# Patient Record
Sex: Female | Born: 1970 | Race: White | Hispanic: No
Health system: Southern US, Community
[De-identification: ages and names within clinical notes are randomized; demographics above are authoritative.]

---

## 2013-09-30 ENCOUNTER — Emergency Department: Payer: Self-pay | Admitting: Internal Medicine

## 2013-10-21 ENCOUNTER — Emergency Department: Payer: Self-pay | Admitting: Internal Medicine

## 2013-11-07 ENCOUNTER — Emergency Department: Payer: Self-pay | Admitting: Emergency Medicine

## 2013-11-24 ENCOUNTER — Inpatient Hospital Stay: Payer: Self-pay | Admitting: Internal Medicine

## 2013-11-24 LAB — COMPREHENSIVE METABOLIC PANEL
Albumin: 3.7 g/dL (ref 3.4–5.0)
Alkaline Phosphatase: 65 U/L
Anion Gap: 4 — ABNORMAL LOW (ref 7–16)
Bilirubin,Total: 0.4 mg/dL (ref 0.2–1.0)
Calcium, Total: 9 mg/dL (ref 8.5–10.1)
Chloride: 109 mmol/L — ABNORMAL HIGH (ref 98–107)
Co2: 26 mmol/L (ref 21–32)
Creatinine: 0.64 mg/dL (ref 0.60–1.30)
EGFR (Non-African Amer.): >60
SGOT(AST): 31 U/L (ref 15–37)
Sodium: 139 mmol/L (ref 136–145)
Total Protein: 7.1 g/dL (ref 6.4–8.2)

## 2013-11-24 LAB — CBC
MCH: 31.5 pg (ref 26.0–34.0)
RDW: 13.8 % (ref 11.5–14.5)

## 2013-11-24 LAB — CK TOTAL AND CKMB (NOT AT ARMC): CK, Total: 144 U/L (ref 21–215)

## 2013-11-24 LAB — TROPONIN I: Troponin-I: <0.02 ng/mL

## 2013-11-27 LAB — CBC WITH DIFFERENTIAL/PLATELET
Basophil #: 0 10*3/uL (ref 0.0–0.1)
Basophil %: 0.1 %
Eosinophil #: 0 10*3/uL (ref 0.0–0.7)
Eosinophil %: 0.2 %
HCT: 38.6 % (ref 35.0–47.0)
Lymphocyte #: 0.9 10*3/uL — ABNORMAL LOW (ref 1.0–3.6)
MCH: 32 pg (ref 26.0–34.0)
MCHC: 33.2 g/dL (ref 32.0–36.0)
Neutrophil #: 19.8 10*3/uL — ABNORMAL HIGH (ref 1.4–6.5)
Neutrophil %: 93.6 %
Platelet: 305 10*3/uL (ref 150–440)
WBC: 21.2 10*3/uL — ABNORMAL HIGH (ref 3.6–11.0)

## 2014-01-31 ENCOUNTER — Emergency Department: Payer: Self-pay | Admitting: Emergency Medicine

## 2014-02-13 ENCOUNTER — Emergency Department: Payer: Self-pay | Admitting: Emergency Medicine

## 2014-05-19 ENCOUNTER — Emergency Department: Payer: Self-pay | Admitting: Emergency Medicine

## 2014-06-11 ENCOUNTER — Emergency Department: Payer: Self-pay | Admitting: Emergency Medicine

## 2014-06-11 LAB — CBC
HCT: 43.6 % (ref 35.0–47.0)
HGB: 14.1 g/dL (ref 12.0–16.0)
MCH: 31.3 pg (ref 26.0–34.0)
MCHC: 32.4 g/dL (ref 32.0–36.0)
MCV: 97 fL (ref 80–100)
PLATELETS: 371 10*3/uL (ref 150–440)
RBC: 4.5 10*6/uL (ref 3.80–5.20)
RDW: 13.9 % (ref 11.5–14.5)
WBC: 11.7 10*3/uL — AB (ref 3.6–11.0)

## 2014-06-11 LAB — BASIC METABOLIC PANEL
Anion Gap: 5 — ABNORMAL LOW (ref 7–16)
BUN: 8 mg/dL (ref 7–18)
CALCIUM: 8.2 mg/dL — AB (ref 8.5–10.1)
CHLORIDE: 108 mmol/L — AB (ref 98–107)
CREATININE: 0.83 mg/dL (ref 0.60–1.30)
Co2: 26 mmol/L (ref 21–32)
EGFR (African American): >60
EGFR (Non-African Amer.): >60
GLUCOSE: 109 mg/dL — AB (ref 65–99)
Osmolality: 276 (ref 275–301)
Potassium: 3.9 mmol/L (ref 3.5–5.1)
Sodium: 139 mmol/L (ref 136–145)

## 2014-06-11 LAB — TROPONIN I

## 2014-09-17 ENCOUNTER — Emergency Department: Payer: Self-pay | Admitting: Emergency Medicine

## 2014-09-17 LAB — URINALYSIS, COMPLETE
Bilirubin,UR: NEGATIVE
Glucose,UR: NEGATIVE mg/dL (ref 0–75)
Ketone: NEGATIVE
NITRITE: NEGATIVE
PROTEIN: NEGATIVE
Ph: 6 (ref 4.5–8.0)
SPECIFIC GRAVITY: 1.002 (ref 1.003–1.030)
SQUAMOUS EPITHELIAL: NONE SEEN
WBC UR: 214 /HPF (ref 0–5)

## 2014-09-21 LAB — URINE CULTURE

## 2014-10-19 ENCOUNTER — Emergency Department: Payer: Self-pay | Admitting: Emergency Medicine

## 2015-02-07 IMAGING — CR DG CHEST 2V
1 series · 2 of 2 positions shown · non-contrast
Comparison: 09/30/2013

CLINICAL DATA: Shortness of breath for 2 days.

EXAM:
CHEST  2 VIEW

[Series 1: w chest pa · 0.14mm/px · 2 of 2 slices shown]
[im 1/2]
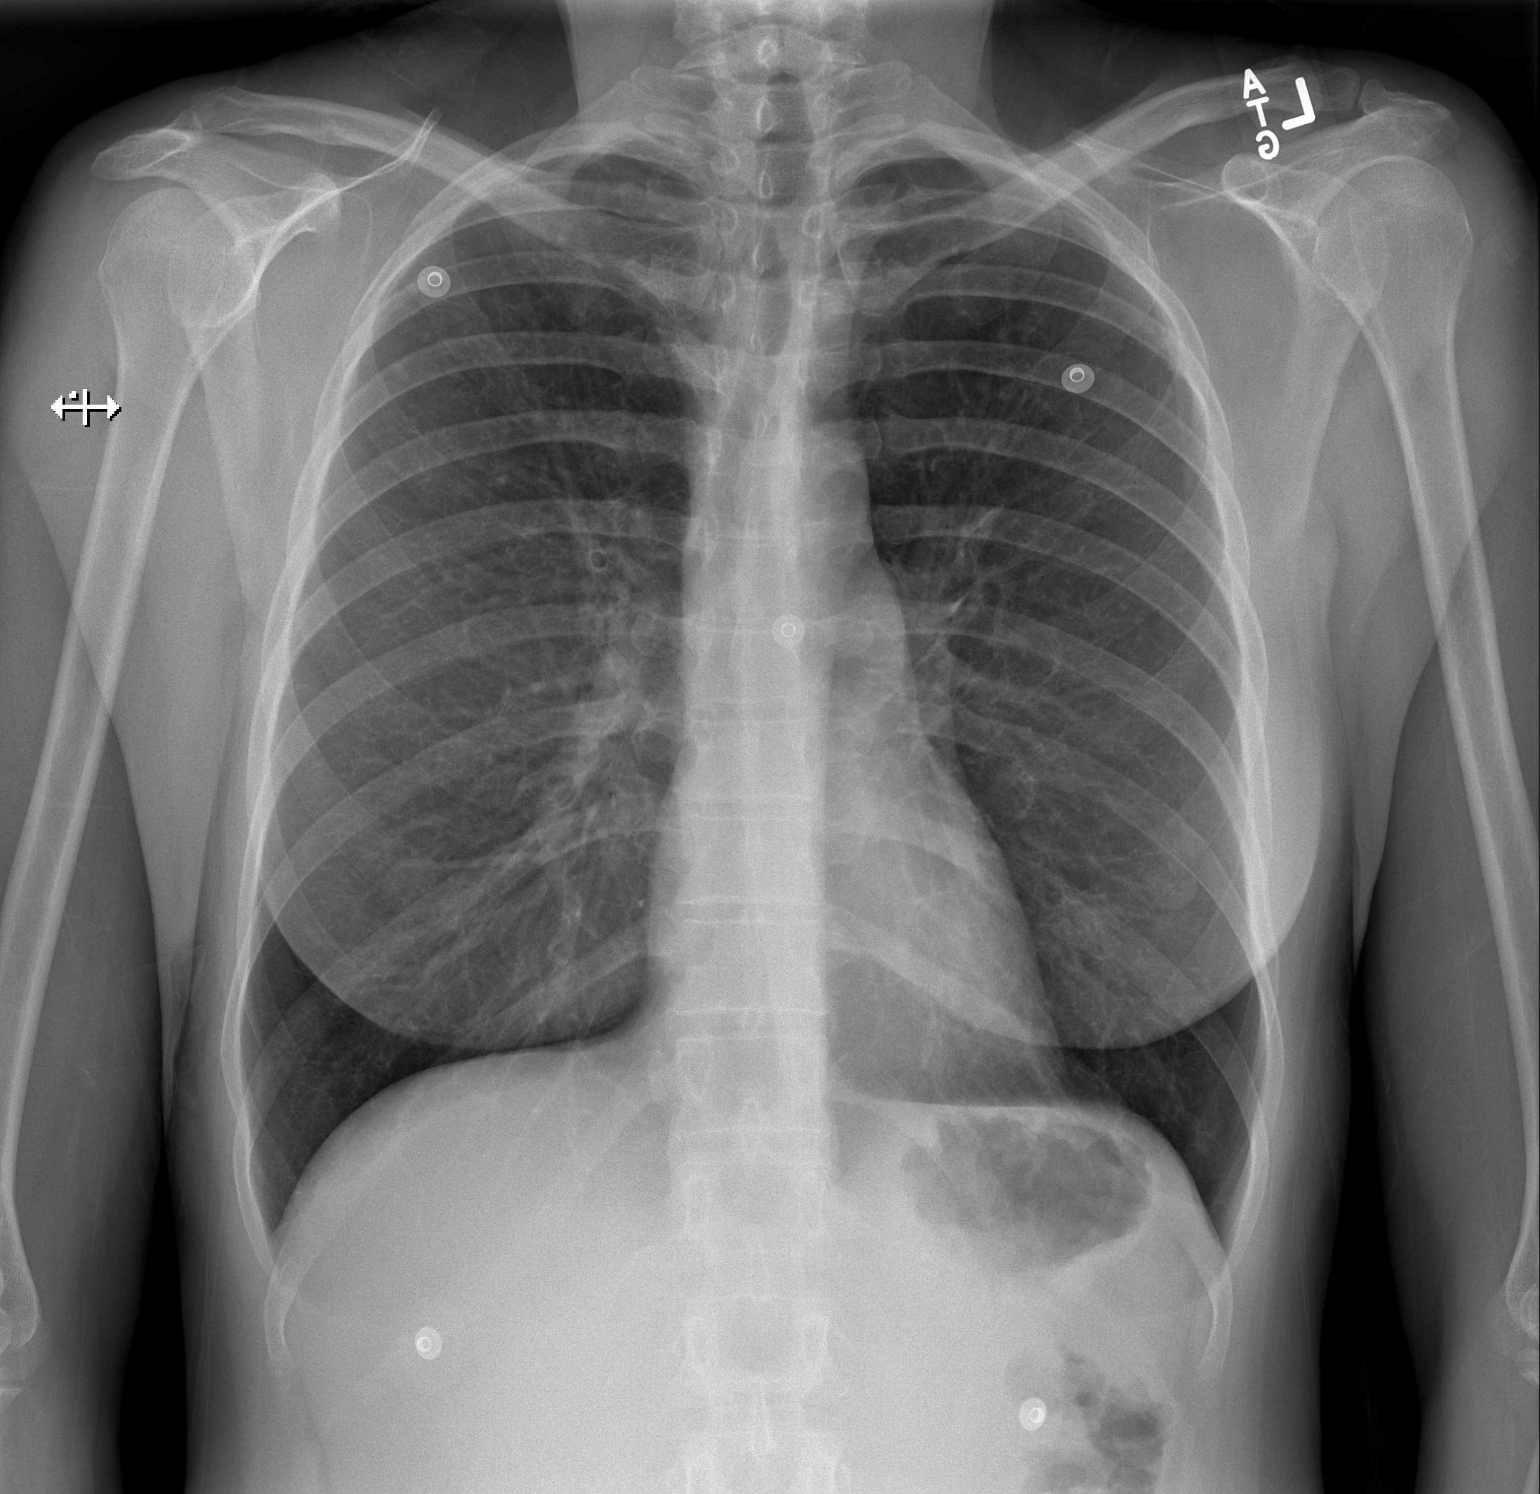
[im 2/2]
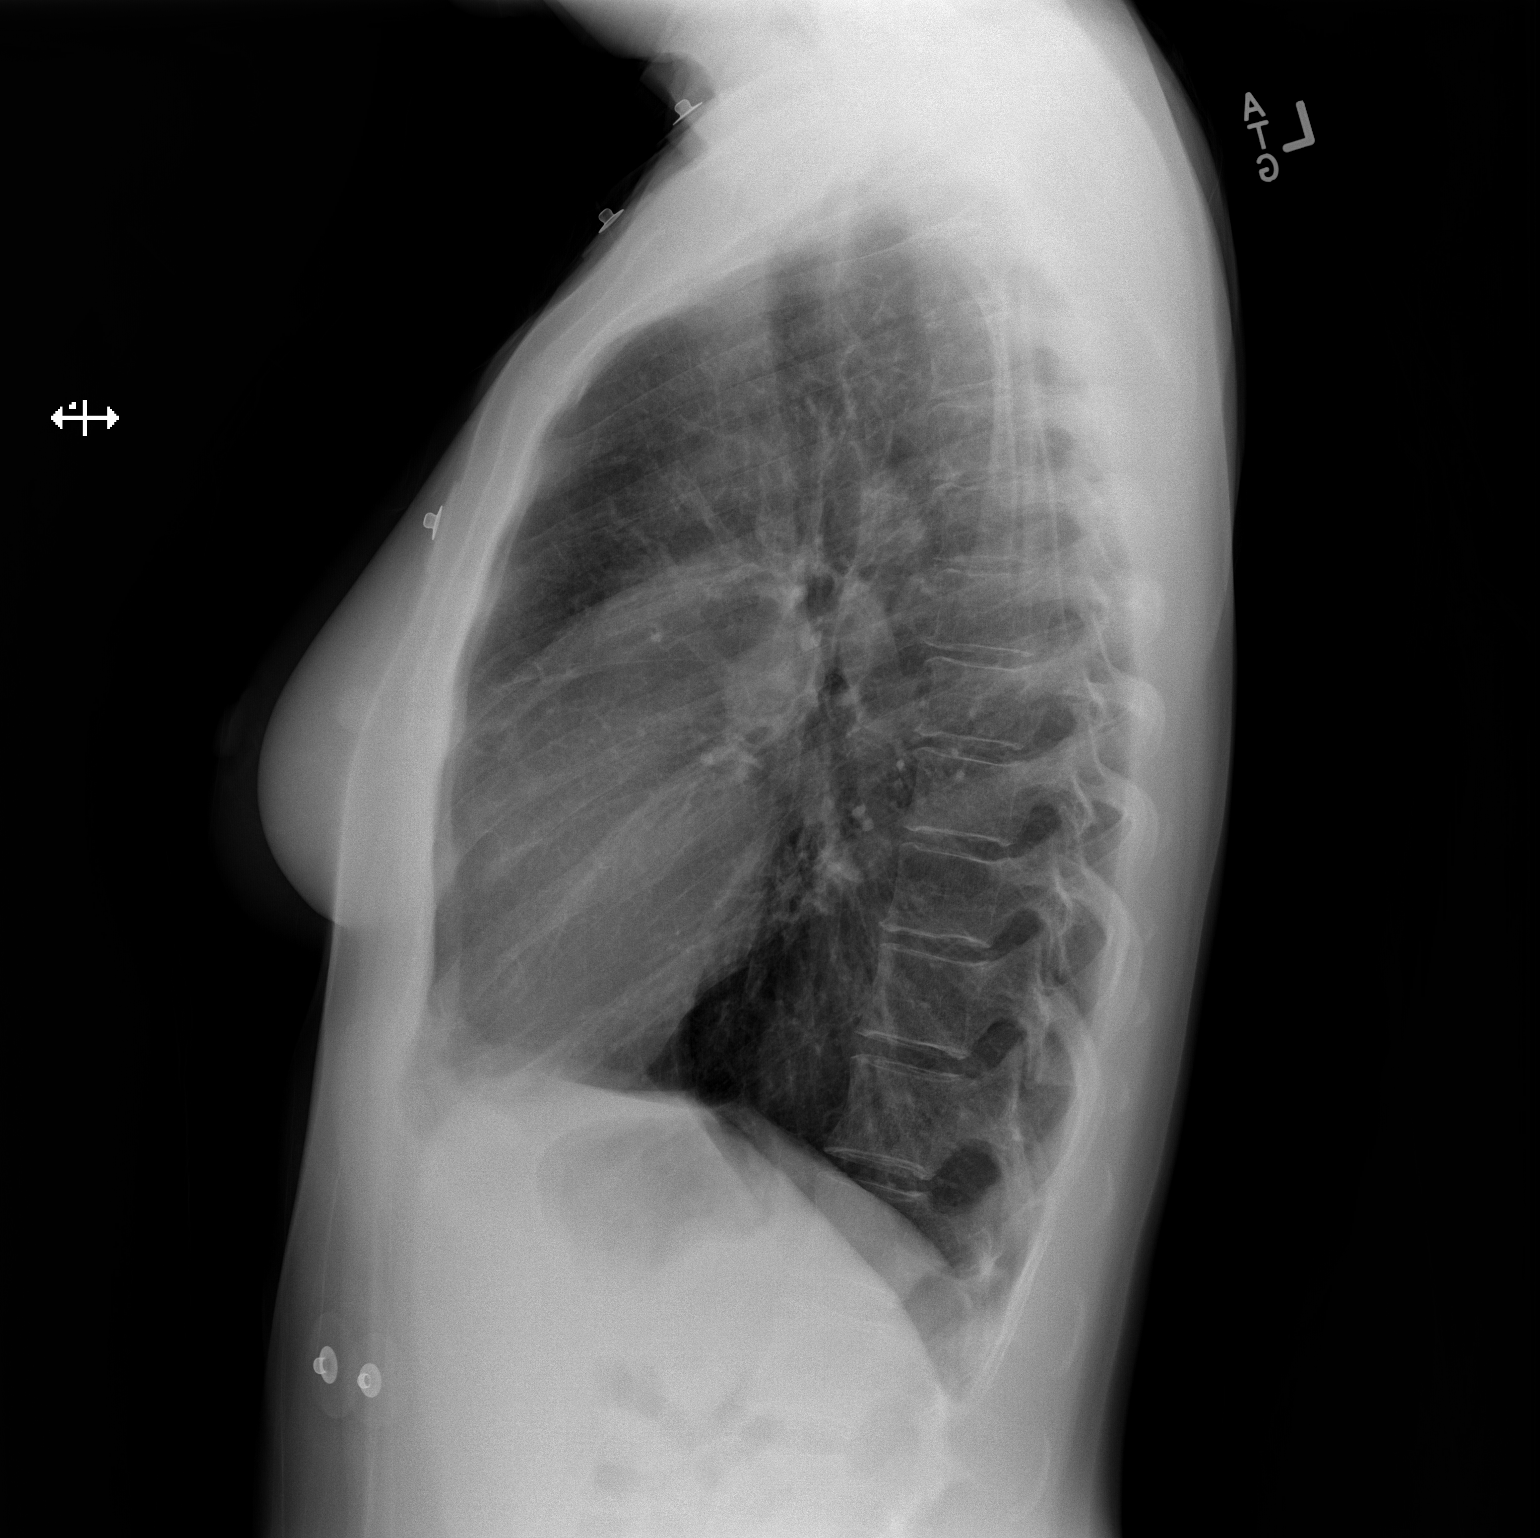

[2 of 2 positions shown; findings below may reference images not displayed]

FINDINGS: The heart size and mediastinal contours are within normal limits.
Both lungs are clear. The visualized skeletal structures are
unremarkable.
IMPRESSION: No active cardiopulmonary disease.

## 2015-02-24 IMAGING — CR DG CHEST 2V
1 series · 2 of 2 positions shown · non-contrast
Comparison: 10/31/2013

CLINICAL DATA: Dyspnea

EXAM:
CHEST  2 VIEW

[Series 1: w chest pa · 0.14mm/px · 2 of 2 slices shown]
[im 1/2]
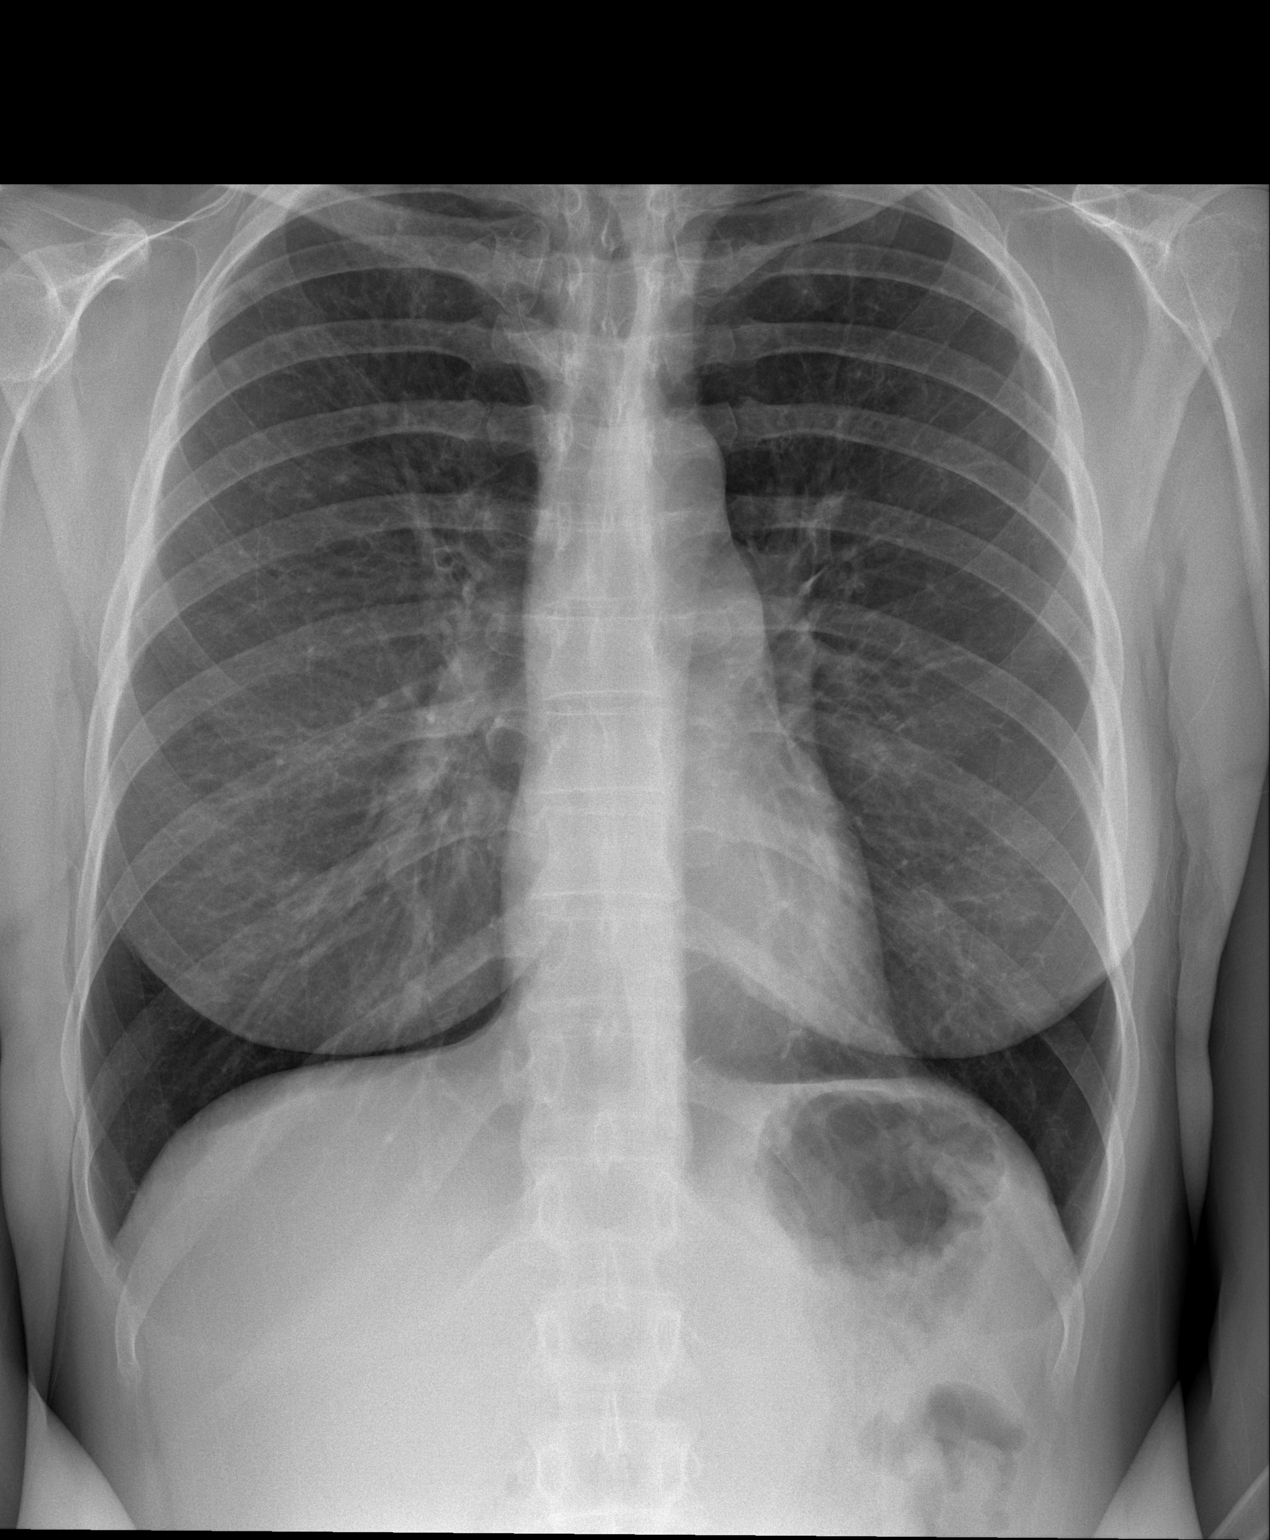
[im 2/2]
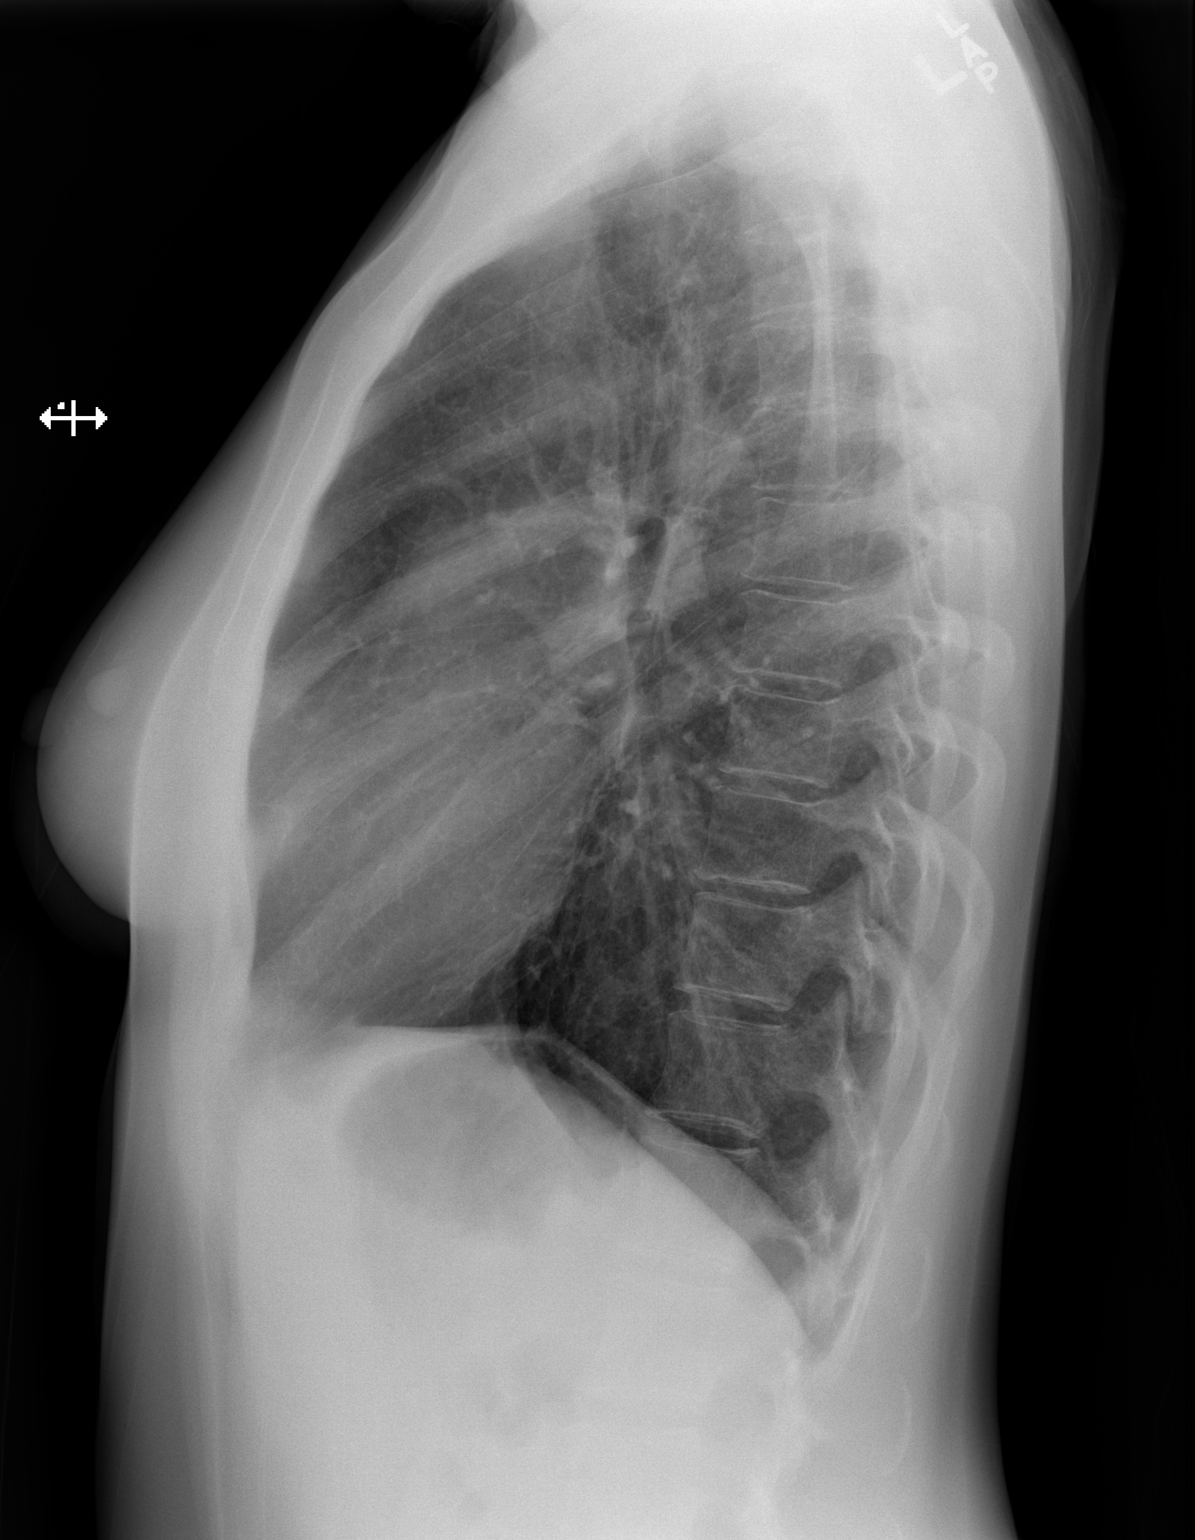

[2 of 2 positions shown; findings below may reference images not displayed]

FINDINGS: The heart size and mediastinal contours are within normal limits.
Both lungs are clear. The visualized skeletal structures are
unremarkable.
IMPRESSION: No active cardiopulmonary disease.

## 2015-04-05 NOTE — H&P (Signed)
PATIENT NAME:  Theresa Webb, Theresa Webb MR#:  [id number] DATE OF BIRTH:  04/19/1971  DATE OF ADMISSION:  11/24/2013  PRIMARY CARE PHYSICIAN: None. The patient is from [place].   CHIEF COMPLAINT: Shortness of breath and wheezing on and off for the past 1 month.   HISTORY OF PRESENT ILLNESS: Theresa Webb is a 44 year old Caucasian female with history of chronic tobacco abuse, history of asthma/COPD, who comes to the Emergency Room. This is her fourth ER visit since September 30, 2013. Comes in with increasing shortness of breath, bilateral wheezing, and dry cough. She denies any fever. She says she has cut down on her smoking significantly and for the last 2 to 3 weeks has not smoked much. She received Solu-Medrol nebulizer treatment. She continued to wheeze. Her sats are 93% on 2 liters. She is being admitted for COPD exacerbation and possible bronchitis.   PAST MEDICAL HISTORY: COPD/asthma with ongoing tobacco abuse.   PAST SURGICAL HISTORY:  1.  Left ovary removed.  2.  Appendectomy.   MEDICATIONS: 1.  ProAir HFA 2 puffs every 4 hours.  2.  DuoNebs every 6 hours as needed.   ALLERGIES: No known drug allergies.   SOCIAL HISTORY: She used to smoke quite a bit; however, the patient states she has quit smoking for about 2 to 2-1/2 weeks. Denies alcohol use. She is currently unemployed.   FAMILY HISTORY: Positive for hypertension in mother and emphysema in mother as well.   REVIEW OF SYSTEMS:    CONSTITUTIONAL: No fever. Positive for fatigue, weakness.  EYES: No blurred or double vision, glaucoma or cataracts.  EARS, NOSE, THROAT: No tinnitus, ear pain, hearing loss or postnasal drip.  RESPIRATORY: Positive for cough, shortness of breath, and positive for asthma.  CARDIOVASCULAR: No chest pain. No orthopnea, edema or hypertension.  GASTROINTESTINAL: No nausea, vomiting, diarrhea, abdominal pain or GERD.  GENITOURINARY: No dysuria, hematuria or frequency.  ENDOCRINE: No polyuria, nocturia or thyroid  problems.  HEMATOLOGY: No anemia or easy bruising or bleeding.  SKIN: No acne or rash.  MUSCULOSKELETAL: Positive for arthritis.  NEUROLOGIC: No CVA, TIA, dementia or migraines.  PSYCHIATRIC: Positive for some anxiety. No depression or bipolar disorder.   All other systems reviewed and negative.   PHYSICAL EXAMINATION: GENERAL: The patient is awake, alert, oriented x 3, not in acute distress. She is mildly anxious.  VITAL SIGNS: She is afebrile. Pulse is 74. Blood pressure is 132/74 sats are 94% on 2 liters. Respirations are 36 per minute.  HEENT: Atraumatic, normocephalic. Pupils: PERRLA. EOM intact. Oral mucosa is moist.  NECK: Supple. No JVD. No carotid bruit.  RESPIRATORY: The patient has bilateral wheezing in both the lungs. No use of accessory muscles or intercostal retracting.  CARDIOVASCULAR: Both the heart sounds are normal. Rate, rhythm is regular. PMI not lateralized. Chest is nontender.  EXTREMITIES: Good pedal pulses, good femoral pulses. No lower extremity edema.  ABDOMEN: Soft, benign, nontender. No organomegaly. Positive bowel sounds.  NEUROLOGIC: Grossly intact cranial nerves II through XII. No motor or sensory deficit.  PSYCHIATRIC: The patient is awake, alert, oriented x 3.  SKIN: Warm and dry.   LABORATORY, DIAGNOSTIC AND RADIOLOGICAL DATA: Chest x-ray consistent with no acute cardiopulmonary disease. Influenza A plus B negative. Troponin is less than 0.02. White count is 13.7; hemoglobin and hematocrit are 13.9 and 41.9; platelet count is 320. Glucose is 119; BUN is 8; creatinine is 0.64; sodium is 139; potassium is 3.9; chloride is 109; bicarbonate is 26; calcium is 9.0. LFTs within  normal limits. EKG shows sinus tachycardia.   ASSESSMENT AND PLAN: A 44 year old Theresa Webb with history of asthma/chronic obstructive pulmonary disease with ongoing tobacco abuse comes in with:  1.  Acute chronic obstructive pulmonary disease exacerbation in the setting of chronic  smoking. The patient reports quit smoking. Will admit her on medical floor and give Solu-Medrol around the clock, DuoNebs around-the-clock along with ProAir HFA, and start the patient on Advair and Spiriva. The patient was advised on smoking.  2.  Bronchitis, acute: Will give course of antibiotic with p.o. Levaquin. Chest x-ray does not show any evidence of pneumonia.  3.  Chronic tobacco abuse: The patient is advised on smoking cessation about 4 minutes and she voiced understanding.  4.  Deep vein thrombosis prophylaxis: The patient is ambulatory.  5.  Further work-up according to the patient's clinical course. Hospital admission plan was discussed with the patient.  6.  The patient is a full code.  TIME SPENT: 40 minutes.   ____________________________ Wylie HailSona A. Allena KatzPatel, MD sap:jcm D: 11/24/2013 20:28:23 ET T: 11/24/2013 21:09:13 ET JOB#: [id number]  cc: Marlisha Vanwyk A. Allena KatzPatel, MD, <Dictator> Willow OraSONA A Kin Galbraith MD ELECTRONICALLY SIGNED 11/30/2013 10:59

## 2015-04-06 NOTE — Discharge Summary (Signed)
PATIENT NAME:  [name], Theresa MR#:  [id number] DATE OF BIRTH:  October 12, 1971  DATE OF ADMISSION:  11/24/2013 DATE OF DISCHARGE:  11/28/2013   DISCHARGE DIAGNOSES:  1. Pneumonia.  2. Chronic obstructive pulmonary disease exacerbation.  3. History of tobacco abuse.   DISCHARGE MEDICATIONS:  1. ProAir HFA 90 mcg 2 puffs 4 times daily for wheezing.  2. Advair Diskus 150/50 one puff b.i.d.  3. Levaquin 750 mg p.o. daily for 5 days.  4. Spiriva 18 mcg inhalation daily.  5. Prednisone dose tapering 20 mg 3 tablets daily for 3 days, 2 tablets daily for 2 days, 1 tab daily for 2 days and then stop.   DIET: Regular diet.   CONSULTATIONS: None.  HOME OXYGEN: None.   HOSPITAL COURSE: This is a 44 year old female patient [place] who came in because of shortness of breath and wheezing for a month. The patient is a heavy smoker, now in the process of quitting. Came in because of shortness of breath and wheezing. The patient admitted for COPD exacerbation. Continued on IV steroids, nebulizers and IV Levaquin. O2 saturations around 93% on 2 liters when she came. The patient had significant wheezing, for the following 3 days. I had to increase her Solu-Medrol, and the patient received IV Solu-Medrol 60 mg q.6 hours for the past 4 days, and before this, she was on 40 mg every 8 hours, and then she is also on Levaquin. The patient's breathing got better. She is able to get out some phlegm. Still has some cough, but her O2 saturations are 96% on room air at rest and also 95% on exertion, so she does not qualify for home oxygen. The patient advised to avoid crowded areas for like about 2 to 3 days and avoid cold air. Continue nebulizers, antibiotics and steroids and aggressively advised to stay away from smoking.   CONDITION: Stable.   TIME SPENT: More than 30 minutes.   REFERRAL: Advised to have a primary doctor in local area.    ____________________________ Katha HammingSnehalatha Fredrick Geoghegan,  MD sk:lb D: 11/28/2013 13:34:22 ET T: 11/28/2013 13:55:09 ET JOB#: [id number]  cc: Katha HammingSnehalatha Baylie Drakes, MD, <Dictator> Katha HammingSNEHALATHA Ivania Teagarden MD ELECTRONICALLY SIGNED 12/16/2013 17:21

## 2016-01-04 IMAGING — CR DG CHEST 2V
1 series · 2 of 2 positions shown · non-contrast
Comparison: [DATE]/-

CLINICAL DATA: Initial encounter for cough and congestion.

EXAM:
CHEST  2 VIEW

[Series 1: dxr chest pa (or ap) and lateral · 0.14mm/px · 2 of 2 slices shown]
[im 1/2]
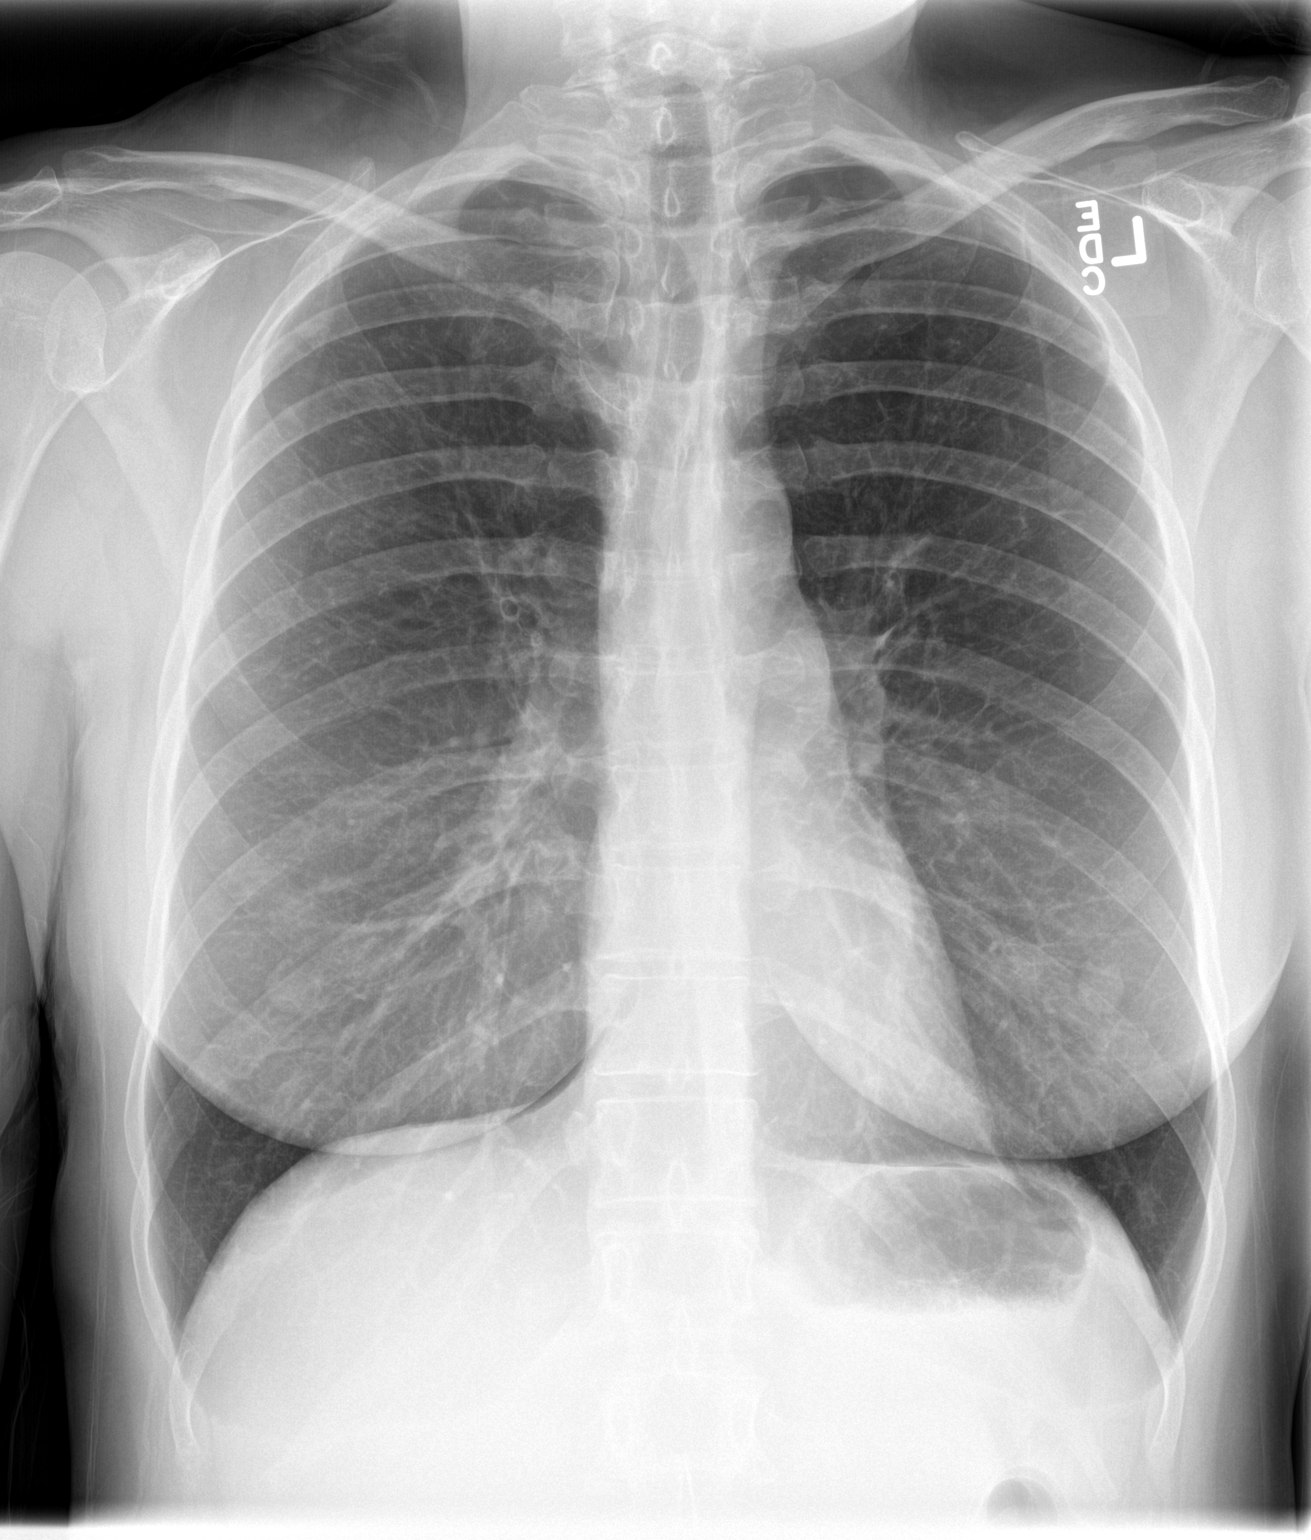
[im 2/2]
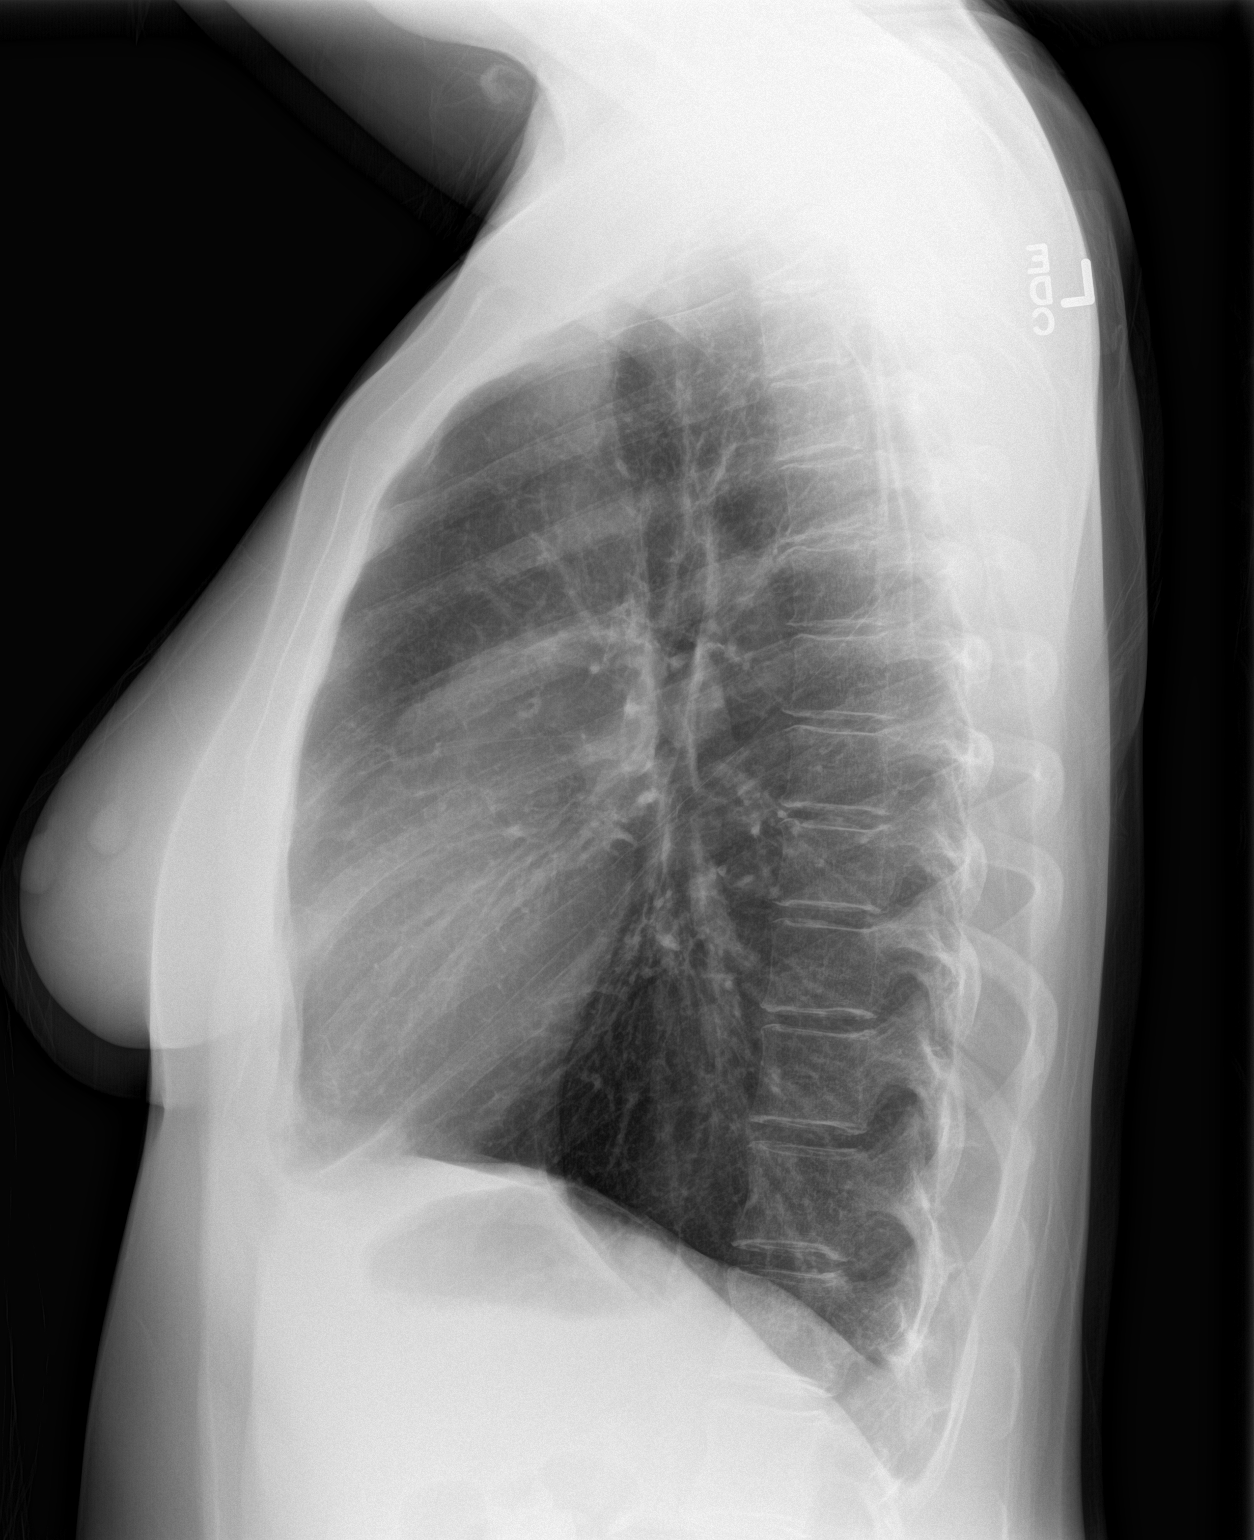

[2 of 2 positions shown; findings below may reference images not displayed]

FINDINGS: The heart size and mediastinal contours are within normal limits.
Both lungs are clear. The visualized skeletal structures are
unremarkable.
IMPRESSION: No active cardiopulmonary disease.
